# Patient Record
Sex: Female | Born: 2000 | Race: White | Hispanic: No | Marital: Single | Smoking: Never smoker
Health system: Southern US, Community
[De-identification: ages and names within clinical notes are randomized; demographics above are authoritative.]

## PROBLEM LIST (undated history)

## (undated) DIAGNOSIS — Z87442 Personal history of urinary calculi: Secondary | ICD-10-CM

## (undated) DIAGNOSIS — D72821 Monocytosis (symptomatic): Secondary | ICD-10-CM

## (undated) DIAGNOSIS — K4090 Unilateral inguinal hernia, without obstruction or gangrene, not specified as recurrent: Secondary | ICD-10-CM

## (undated) HISTORY — DX: Monocytosis (symptomatic): D72.821

---
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
## 2018-06-24 ENCOUNTER — Telehealth: Payer: Self-pay | Admitting: Urology

## 2018-06-24 DIAGNOSIS — N132 Hydronephrosis with renal and ureteral calculous obstruction: Secondary | ICD-10-CM | POA: Diagnosis not present

## 2018-06-24 DIAGNOSIS — R112 Nausea with vomiting, unspecified: Secondary | ICD-10-CM | POA: Diagnosis not present

## 2018-06-24 DIAGNOSIS — R109 Unspecified abdominal pain: Secondary | ICD-10-CM

## 2018-06-24 DIAGNOSIS — R300 Dysuria: Secondary | ICD-10-CM | POA: Diagnosis not present

## 2018-06-24 DIAGNOSIS — N200 Calculus of kidney: Secondary | ICD-10-CM | POA: Diagnosis not present

## 2018-06-24 DIAGNOSIS — R319 Hematuria, unspecified: Secondary | ICD-10-CM | POA: Diagnosis not present

## 2018-06-24 DIAGNOSIS — R630 Anorexia: Secondary | ICD-10-CM | POA: Diagnosis not present

## 2018-06-24 NOTE — Telephone Encounter (Signed)
Patient called over the weekend, seeking to establish care at [facility].  She was seen in the emergency room at [facility] with acute onset left flank pain found to have mild left-sided hydronephrosis.  She is also noted to have a right lower pole stone measuring 0.8 cm.  They will to get her pain under control and she was discharged home.  She was given a urinary strainer.  Plan to go ahead and order a KUB to establish the size and location of the stone (unless she passes the stone in the interim) and see her early this week.  Vanna Scotland, MD

## 2018-06-25 ENCOUNTER — Ambulatory Visit
Admission: RE | Admit: 2018-06-25 | Discharge: 2018-06-25 | Disposition: A | Discharge status: Home / Self Care | Payer: 59 | Source: Ambulatory Visit | Attending: Urology | Admitting: Urology

## 2018-06-25 DIAGNOSIS — R109 Unspecified abdominal pain: Secondary | ICD-10-CM | POA: Diagnosis not present

## 2018-06-26 ENCOUNTER — Other Ambulatory Visit: Payer: Self-pay | Admitting: Radiology

## 2018-06-26 DIAGNOSIS — N201 Calculus of ureter: Secondary | ICD-10-CM

## 2018-06-27 ENCOUNTER — Other Ambulatory Visit: Payer: Self-pay | Admitting: Radiology

## 2018-06-27 DIAGNOSIS — N201 Calculus of ureter: Secondary | ICD-10-CM

## 2018-06-27 MED ORDER — CIPROFLOXACIN HCL 500 MG PO TABS
500.0000 mg | ORAL_TABLET | ORAL | Status: AC
  Administered 2018-06-28: 500 mg via ORAL

## 2018-06-28 ENCOUNTER — Ambulatory Visit (INDEPENDENT_AMBULATORY_CARE_PROVIDER_SITE_OTHER): Payer: 59 | Admitting: Urology

## 2018-06-28 ENCOUNTER — Ambulatory Visit
Admission: RE | Admit: 2018-06-28 | Discharge: 2018-06-28 | Disposition: A | Discharge status: Home / Self Care | Payer: 59 | Source: Ambulatory Visit | Attending: Urology | Admitting: Urology

## 2018-06-28 ENCOUNTER — Other Ambulatory Visit: Payer: Self-pay

## 2018-06-28 ENCOUNTER — Encounter: Payer: Self-pay | Admitting: Urology

## 2018-06-28 ENCOUNTER — Encounter
Admission: RE | Disposition: A | Discharge status: Home / Self Care | Payer: Self-pay | Source: Ambulatory Visit | Attending: Urology

## 2018-06-28 ENCOUNTER — Encounter: Payer: Self-pay | Admitting: *Deleted

## 2018-06-28 VITALS — BP 125/87 | HR 75 | Ht 65.0 in | Wt 149.0 lb

## 2018-06-28 DIAGNOSIS — N132 Hydronephrosis with renal and ureteral calculous obstruction: Secondary | ICD-10-CM | POA: Diagnosis not present

## 2018-06-28 DIAGNOSIS — N200 Calculus of kidney: Secondary | ICD-10-CM | POA: Diagnosis not present

## 2018-06-28 DIAGNOSIS — N201 Calculus of ureter: Secondary | ICD-10-CM

## 2018-06-28 DIAGNOSIS — R109 Unspecified abdominal pain: Secondary | ICD-10-CM | POA: Diagnosis not present

## 2018-06-28 HISTORY — DX: Unilateral inguinal hernia, without obstruction or gangrene, not specified as recurrent: K40.90

## 2018-06-28 HISTORY — PX: EXTRACORPOREAL SHOCK WAVE LITHOTRIPSY: SHX1557

## 2018-06-28 HISTORY — DX: Personal history of urinary calculi: Z87.442

## 2018-06-28 LAB — MICROSCOPIC EXAMINATION: Bacteria, UA: NONE SEEN

## 2018-06-28 LAB — URINALYSIS, COMPLETE
Bilirubin, UA: NEGATIVE
Glucose, UA: NEGATIVE
Ketones, UA: NEGATIVE
Nitrite, UA: NEGATIVE
PH UA: 7.5 (ref 5.0–7.5)
Protein, UA: NEGATIVE
SPEC GRAV UA: 1.02 (ref 1.005–1.030)
Urobilinogen, Ur: 0.2 mg/dL (ref 0.2–1.0)

## 2018-06-28 LAB — POCT PREGNANCY, URINE: Preg Test, Ur: NEGATIVE

## 2018-06-28 SURGERY — LITHOTRIPSY, ESWL
Anesthesia: Moderate Sedation | Laterality: Left

## 2018-06-28 MED ORDER — TAMSULOSIN HCL 0.4 MG PO CAPS: 0.4000 mg | ORAL_CAPSULE | Freq: Every day | ORAL | 0 refills | Status: DC

## 2018-06-28 MED ORDER — ONDANSETRON HCL 4 MG/2ML IJ SOLN
4.0000 mg | Freq: Once | INTRAMUSCULAR | Status: AC | PRN
  Administered 2018-06-28: 4 mg via INTRAVENOUS

## 2018-06-28 MED ORDER — DIPHENHYDRAMINE HCL 25 MG PO CAPS
25.0000 mg | ORAL_CAPSULE | ORAL | Status: AC
  Administered 2018-06-28: 25 mg via ORAL

## 2018-06-28 MED ORDER — DIAZEPAM 5 MG PO TABS
10.0000 mg | ORAL_TABLET | ORAL | Status: AC
  Administered 2018-06-28: 10 mg via ORAL

## 2018-06-28 MED ORDER — DIAZEPAM 5 MG PO TABS
ORAL_TABLET | ORAL | Status: AC
  Filled 2018-06-28: qty 2

## 2018-06-28 MED ORDER — DOCUSATE SODIUM 100 MG PO CAPS: 100.0000 mg | ORAL_CAPSULE | Freq: Two times a day (BID) | ORAL | 0 refills | Status: DC

## 2018-06-28 MED ORDER — HYDROCODONE-ACETAMINOPHEN 5-325 MG PO TABS: 1.0000 | ORAL_TABLET | Freq: Four times a day (QID) | ORAL | 0 refills | Status: DC | PRN

## 2018-06-28 MED ORDER — ONDANSETRON 4 MG PO TBDP
ORAL_TABLET | ORAL | Status: AC
  Filled 2018-06-28: qty 1

## 2018-06-28 MED ORDER — ONDANSETRON HCL 4 MG/2ML IJ SOLN
INTRAMUSCULAR | Status: AC
  Administered 2018-06-28: 4 mg via INTRAVENOUS
  Filled 2018-06-28: qty 2

## 2018-06-28 MED ORDER — CIPROFLOXACIN HCL 500 MG PO TABS
ORAL_TABLET | ORAL | Status: AC
  Filled 2018-06-28: qty 1

## 2018-06-28 MED ORDER — SODIUM CHLORIDE 0.9 % IV SOLN
INTRAVENOUS | Status: DC
  Administered 2018-06-28: 10:00:00 via INTRAVENOUS

## 2018-06-28 MED ORDER — DIPHENHYDRAMINE HCL 25 MG PO CAPS
ORAL_CAPSULE | ORAL | Status: AC
  Filled 2018-06-28: qty 1

## 2018-06-28 NOTE — Interval H&P Note (Signed)
History and Physical Interval Note:  06/28/2018 7:54 PM  Jennifer Andersen  has presented today for surgery, with the diagnosis of Kidney stone  The various methods of treatment have been discussed with the patient and family. After consideration of risks, benefits and other options for treatment, the patient has consented to  Procedure(s): EXTRACORPOREAL SHOCK WAVE LITHOTRIPSY (ESWL) (Left) as a surgical intervention .  The patient's history has been reviewed, patient examined, no change in status, stable for surgery.  I have reviewed the patient's chart and labs.  Questions were answered to the patient's satisfaction.     Vanna Scotland

## 2018-06-28 NOTE — Discharge Instructions (Signed)
AMBULATORY SURGERY  DISCHARGE INSTRUCTIONS   1) The drugs that you were given will stay in your system until tomorrow so for the next 24 hours you should not:  A) Drive an automobile B) Make any legal decisions C) Drink any alcoholic beverage   2) You may resume regular meals tomorrow.  Today it is better to start with liquids and gradually work up to solid foods.  You may eat anything you prefer, but it is better to start with liquids, then soup and crackers, and gradually work up to solid foods.   3) Please notify your doctor immediately if you have any unusual bleeding, trouble breathing, redness and pain at the surgery site, drainage, fever, or pain not relieved by medication.    4) Additional Instructions:   Please contact your physician with any problems or Same Day Surgery at [phone], Monday through Friday 6 am to 4 pm, or [place] [facility] [place] Main number at [phone].See [facility] discharge instructions in chart.  

## 2018-06-28 NOTE — H&P (View-Only) (Signed)
06/28/2018 8:54 AM   Jennifer Andersen 2001-07-30 [id number]  Referring provider: Edwyna Perfect, MD [phone] [address] [place], [place] [address]  Chief Complaint  Patient presents with  . Nephrolithiasis    New Patient    HPI: 17 year old female who presents today for evaluation/treatment for left distal ureteral stone.Marland Kitchen  She was seen in the emergency severe left flank pain with associated nausea and vomiting.  She had a renal ultrasound performed at that time which left hydronephrosis, bilateral extrarenal pelvices, nonobstructing stone.  She had a KUB which demonstrated a left distal stone.  KUB repeated today shows no interval progression.  She does not have any overt pain at this point time she does feel some soreness in her left lower quadrant.  No further nausea or vomiting.  No fevers or chills.  No dysuria or gross hematuria.  She denies previous stone episodes.  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    PMH: History reviewed. No pertinent past medical history.  Surgical History: Past Surgical History:  Procedure Laterality Date  . HERNIA REPAIR      Home Medications:  Allergies as of 06/28/2018   No Known Allergies     Medication List    as of 06/28/2018  8:54 AM   You have not been prescribed any medications.     Allergies: No Known Allergies  Family History: Family History  Problem Relation Age of Onset  . Asthma Sister     Social History:  reports that she has never smoked. She has never used smokeless tobacco. She reports that she does not drink alcohol or use drugs.  ROS: UROLOGY Frequent Urination?: No Hard to postpone urination?: No Burning/pain with urination?: No Get up at night to urinate?: No Leakage of urine?: No Urine stream starts and stops?: No Trouble starting stream?: No Do you have to strain to urinate?: No Blood in urine?: No Urinary tract infection?: No Sexually transmitted disease?: No Injury to kidneys or bladder?:  No Painful intercourse?: No Weak stream?: No Currently pregnant?: No Vaginal bleeding?: No Last menstrual period?: n  Gastrointestinal Nausea?: No Vomiting?: No Indigestion/heartburn?: No Diarrhea?: No Constipation?: No  Constitutional Fever: No Night sweats?: No Weight loss?: No Fatigue?: No  Skin Skin rash/lesions?: No Itching?: No  Eyes Blurred vision?: No Double vision?: No  Ears/Nose/Throat Sore throat?: No Sinus problems?: No  Hematologic/Lymphatic Swollen glands?: No Easy bruising?: No  Cardiovascular Leg swelling?: No Chest pain?: No  Respiratory Cough?: Yes Shortness of breath?: No  Endocrine Excessive thirst?: No  Musculoskeletal Joint pain?: No  Neurological Headaches?: No Dizziness?: No  Psychologic Depression?: No Anxiety?: No  Physical Exam: BP 125/87   Pulse 75   Ht 5' 5" (1.651 m)   Wt 149 lb (67.6 kg)   LMP 06/08/2018   BMI 24.79 kg/m   Constitutional:  Alert and oriented, No acute distress. HEENT: [place] AT, moist mucus membranes.  Trachea midline, no masses. Cardiovascular: No clubbing, cyanosis, or edema. Respiratory: Normal respiratory effort, no increased work of breathing. GI: Abdomen is soft, nontender, nondistended, no abdominal masses GU: No CVA tenderness Neurologic: Grossly intact, no focal deficits, moving all 4 extremities. Psychiatric: Normal mood and affect.  Laboratory Data:  Urinalysis UA reviewed today, no red blood cells or white blood cells.  No concern for UTI.  Pertinent Imaging: Results for orders placed during the hospital encounter of 06/28/18  Abdomen 1 view (KUB)   Narrative CLINICAL DATA:  Acute left flank pain.  EXAM:  ABDOMEN - 1 VIEW  COMPARISON:  Radiograph of June 25, 2018.  FINDINGS: The bowel gas pattern is normal. Stable calcification seen in left pelvis which may represent either phlebolith or distal left ureteral calculus.  IMPRESSION: Stable left pelvic calcification  is noted which may represent either phlebolith or distal left ureteral calculus.   Electronically Signed   By: Marijo Conception, M.D.   On: 06/28/2018 08:43    KUB personally reviewed today.  Assessment & Plan:    1. Left ureteral stone Given her history of left flank pain, left hydronephrosis with a calcific lesion in the left pelvis, clinically this represents a 4 mm obstructing left ureteral calculus which is failed to pass spontaneously.  We discussed various treatment options including ESWL vs. ureteroscopy, laser lithotripsy, and stent vs. MET. We discussed the risks and benefits of both including bleeding, infection, damage to surrounding structures, efficacy with need for possible further intervention, and need for temporary ureteral stent.  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  Stone diet recommendations were discussed today.  - Urinalysis, Complete  2. Renal calculus, right Nonobstructing stone per ultrasound, difficult to visualize on KUB today but it does appear that she may have a possibly 5 mm left lower pole stone on previous KUB.  Recommend observation at this time for this stone.   Hollice Espy, MD  [facility] [address] [place], [place] [address] [phone]

## 2018-06-28 NOTE — Progress Notes (Signed)
 06/28/2018 8:54 AM   Jennifer Andersen 03/23/2001 [id number]  Referring provider: Mitra, Kunal, MD [address] [place], [place] [address]  Chief Complaint  Patient presents with  . Nephrolithiasis    New Patient    HPI: 17-year-old female who presents today for evaluation/treatment for left distal ureteral stone..  She was seen in the emergency severe left flank pain with associated nausea and vomiting.  She had a renal ultrasound performed at that time which left hydronephrosis, bilateral extrarenal pelvices, nonobstructing stone.  She had a KUB which demonstrated a left distal stone.  KUB repeated today shows no interval progression.  She does not have any overt pain at this point time she does feel some soreness in her left lower quadrant.  No further nausea or vomiting.  No fevers or chills.  No dysuria or gross hematuria.  She denies previous stone episodes.  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    PMH: History reviewed. No pertinent past medical history.  Surgical History: Past Surgical History:  Procedure Laterality Date  . HERNIA REPAIR      Home Medications:  Allergies as of 06/28/2018   No Known Allergies     Medication List    as of 06/28/2018  8:54 AM   You have not been prescribed any medications.     Allergies: No Known Allergies  Family History: Family History  Problem Relation Age of Onset  . Asthma Sister     Social History:  reports that she has never smoked. She has never used smokeless tobacco. She reports that she does not drink alcohol or use drugs.  ROS: UROLOGY Frequent Urination?: No Hard to postpone urination?: No Burning/pain with urination?: No Get up at night to urinate?: No Leakage of urine?: No Urine stream starts and stops?: No Trouble starting stream?: No Do you have to strain to urinate?: No Blood in urine?: No Urinary tract infection?: No Sexually transmitted disease?: No Injury to kidneys or bladder?:  No Painful intercourse?: No Weak stream?: No Currently pregnant?: No Vaginal bleeding?: No Last menstrual period?: n  Gastrointestinal Nausea?: No Vomiting?: No Indigestion/heartburn?: No Diarrhea?: No Constipation?: No  Constitutional Fever: No Night sweats?: No Weight loss?: No Fatigue?: No  Skin Skin rash/lesions?: No Itching?: No  Eyes Blurred vision?: No Double vision?: No  Ears/Nose/Throat Sore throat?: No Sinus problems?: No  Hematologic/Lymphatic Swollen glands?: No Easy bruising?: No  Cardiovascular Leg swelling?: No Chest pain?: No  Respiratory Cough?: Yes Shortness of breath?: No  Endocrine Excessive thirst?: No  Musculoskeletal Joint pain?: No  Neurological Headaches?: No Dizziness?: No  Psychologic Depression?: No Anxiety?: No  Physical Exam: BP 125/87   Pulse 75   Ht 5' 5" (1.651 m)   Wt 149 lb (67.6 kg)   LMP 06/08/2018   BMI 24.79 kg/m   Constitutional:  Alert and oriented, No acute distress. HEENT: [place] AT, moist mucus membranes.  Trachea midline, no masses. Cardiovascular: No clubbing, cyanosis, or edema. Respiratory: Normal respiratory effort, no increased work of breathing. GI: Abdomen is soft, nontender, nondistended, no abdominal masses GU: No CVA tenderness Neurologic: Grossly intact, no focal deficits, moving all 4 extremities. Psychiatric: Normal mood and affect.  Laboratory Data:  Urinalysis UA reviewed today, no red blood cells or white blood cells.  No concern for UTI.  Pertinent Imaging: Results for orders placed during the hospital encounter of 06/28/18  Abdomen 1 view (KUB)   Narrative CLINICAL DATA:  Acute left flank pain.  EXAM:   ABDOMEN - 1 VIEW  COMPARISON:  Radiograph of June 25, 2018.  FINDINGS: The bowel gas pattern is normal. Stable calcification seen in left pelvis which may represent either phlebolith or distal left ureteral calculus.  IMPRESSION: Stable left pelvic calcification  is noted which may represent either phlebolith or distal left ureteral calculus.   Electronically Signed   By: James  Green Jr, M.D.   On: 06/28/2018 08:43    KUB personally reviewed today.  Assessment & Plan:    1. Left ureteral stone Given her history of left flank pain, left hydronephrosis with a calcific lesion in the left pelvis, clinically this represents a 4 mm obstructing left ureteral calculus which is failed to pass spontaneously.  We discussed various treatment options including ESWL vs. ureteroscopy, laser lithotripsy, and stent vs. MET. We discussed the risks and benefits of both including bleeding, infection, damage to surrounding structures, efficacy with need for possible further intervention, and need for temporary ureteral stent.  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  Stone diet recommendations were discussed today.  - Urinalysis, Complete  2. Renal calculus, right Nonobstructing stone per ultrasound, difficult to visualize on KUB today but it does appear that she may have a possibly 5 mm left lower pole stone on previous KUB.  Recommend observation at this time for this stone.   Ashley Brandon, MD  [facility] [address], [address] , [place] [address] [phone]  

## 2018-06-28 NOTE — OR Nursing (Signed)
Discharge instructions discussed with pt and mother. Both voice understanding. 

## 2018-06-29 ENCOUNTER — Encounter: Payer: Self-pay | Admitting: Urology

## 2018-07-02 ENCOUNTER — Encounter: Payer: Self-pay | Admitting: Urology

## 2018-07-02 DIAGNOSIS — N200 Calculus of kidney: Secondary | ICD-10-CM | POA: Diagnosis not present

## 2018-07-09 ENCOUNTER — Other Ambulatory Visit: Payer: Self-pay | Admitting: Urology

## 2018-07-09 DIAGNOSIS — Z23 Encounter for immunization: Secondary | ICD-10-CM | POA: Diagnosis not present

## 2018-07-12 ENCOUNTER — Ambulatory Visit
Admission: RE | Admit: 2018-07-12 | Discharge: 2018-07-12 | Disposition: A | Discharge status: Home / Self Care | Payer: 59 | Source: Ambulatory Visit | Attending: Urology | Admitting: Urology

## 2018-07-12 DIAGNOSIS — N201 Calculus of ureter: Secondary | ICD-10-CM | POA: Diagnosis not present

## 2018-07-12 DIAGNOSIS — N200 Calculus of kidney: Secondary | ICD-10-CM | POA: Diagnosis not present

## 2018-07-13 ENCOUNTER — Telehealth: Payer: Self-pay | Admitting: Family Medicine

## 2018-07-13 NOTE — Telephone Encounter (Signed)
Patient's mother notified.

## 2018-07-13 NOTE — Telephone Encounter (Signed)
-----   Message from Vanna Scotland, MD sent at 07/13/2018 11:47 AM EDT ----- Please let Jennifer Andersen know her stone is GONE!!!  Vanna Scotland, MD

## 2018-09-05 DIAGNOSIS — Z0000 Encounter for general adult medical examination without abnormal findings: Secondary | ICD-10-CM | POA: Diagnosis not present

## 2018-09-05 DIAGNOSIS — Z713 Dietary counseling and surveillance: Secondary | ICD-10-CM | POA: Diagnosis not present

## 2018-09-05 DIAGNOSIS — Z68.41 Body mass index (BMI) pediatric, 5th percentile to less than 85th percentile for age: Secondary | ICD-10-CM | POA: Diagnosis not present

## 2018-11-24 IMAGING — CR DG ABDOMEN 1V
1 series · 1 of 1 positions shown · non-contrast
Comparison: 06/28/2018

CLINICAL DATA: Kidney stone

EXAM:
ABDOMEN - 1 VIEW

[dg abd 1 view]
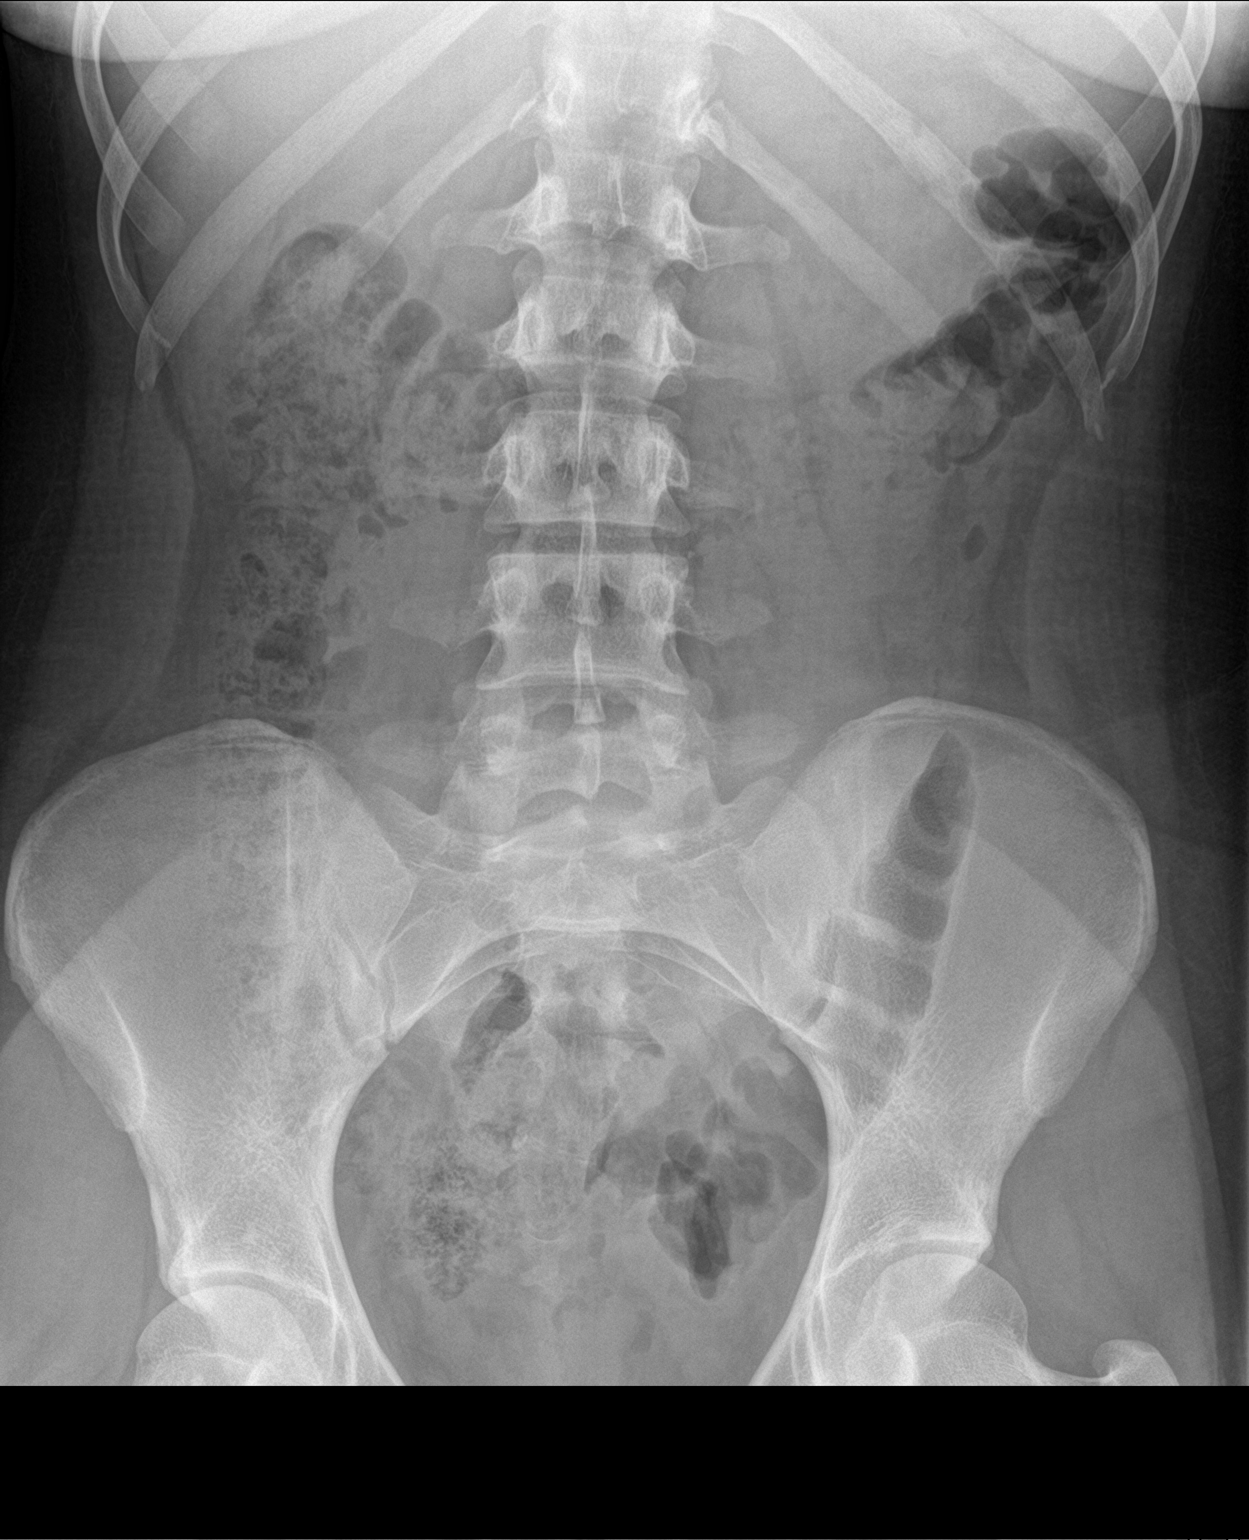

[1 of 1 positions shown; findings below may reference images not displayed]

FINDINGS: Nonobstructed gas pattern with moderate stool in the colon. No
radiopaque calculi are seen. Left pelvic calcification is not
visible today.
IMPRESSION: Previously noted left pelvic calcification is not clearly identified
on today's study. Nonobstructed gas pattern

## 2019-03-07 DIAGNOSIS — B078 Other viral warts: Secondary | ICD-10-CM | POA: Diagnosis not present

## 2019-03-23 DIAGNOSIS — Z20828 Contact with and (suspected) exposure to other viral communicable diseases: Secondary | ICD-10-CM | POA: Diagnosis not present

## 2019-03-23 DIAGNOSIS — U071 COVID-19: Secondary | ICD-10-CM | POA: Diagnosis not present

## 2019-07-18 DIAGNOSIS — B078 Other viral warts: Secondary | ICD-10-CM | POA: Diagnosis not present

## 2019-10-01 DIAGNOSIS — B079 Viral wart, unspecified: Secondary | ICD-10-CM | POA: Diagnosis not present

## 2019-10-01 DIAGNOSIS — Z713 Dietary counseling and surveillance: Secondary | ICD-10-CM | POA: Diagnosis not present

## 2019-10-01 DIAGNOSIS — Z0000 Encounter for general adult medical examination without abnormal findings: Secondary | ICD-10-CM | POA: Diagnosis not present

## 2019-10-01 DIAGNOSIS — Z20828 Contact with and (suspected) exposure to other viral communicable diseases: Secondary | ICD-10-CM | POA: Diagnosis not present

## 2019-10-01 DIAGNOSIS — Z6826 Body mass index (BMI) 26.0-26.9, adult: Secondary | ICD-10-CM | POA: Diagnosis not present

## 2019-12-30 DIAGNOSIS — B2790 Infectious mononucleosis, unspecified without complication: Secondary | ICD-10-CM | POA: Diagnosis not present

## 2019-12-30 DIAGNOSIS — Z20822 Contact with and (suspected) exposure to covid-19: Secondary | ICD-10-CM | POA: Diagnosis not present

## 2019-12-30 DIAGNOSIS — J029 Acute pharyngitis, unspecified: Secondary | ICD-10-CM | POA: Diagnosis not present

## 2019-12-30 DIAGNOSIS — R509 Fever, unspecified: Secondary | ICD-10-CM | POA: Diagnosis not present

## 2020-01-02 ENCOUNTER — Other Ambulatory Visit: Payer: Self-pay

## 2020-01-02 ENCOUNTER — Ambulatory Visit
Admission: EM | Admit: 2020-01-02 | Discharge: 2020-01-02 | Disposition: A | Discharge status: Home / Self Care | Payer: 59

## 2020-01-03 DIAGNOSIS — R591 Generalized enlarged lymph nodes: Secondary | ICD-10-CM | POA: Diagnosis not present

## 2020-01-03 DIAGNOSIS — R1319 Other dysphagia: Secondary | ICD-10-CM | POA: Diagnosis not present

## 2020-01-03 DIAGNOSIS — Z20822 Contact with and (suspected) exposure to covid-19: Secondary | ICD-10-CM | POA: Diagnosis not present

## 2020-01-03 DIAGNOSIS — B2799 Infectious mononucleosis, unspecified with other complication: Secondary | ICD-10-CM | POA: Diagnosis not present

## 2020-01-03 DIAGNOSIS — Z09 Encounter for follow-up examination after completed treatment for conditions other than malignant neoplasm: Secondary | ICD-10-CM | POA: Diagnosis not present

## 2020-01-03 DIAGNOSIS — M791 Myalgia, unspecified site: Secondary | ICD-10-CM | POA: Diagnosis not present

## 2020-01-03 DIAGNOSIS — J029 Acute pharyngitis, unspecified: Secondary | ICD-10-CM | POA: Diagnosis not present

## 2020-01-03 DIAGNOSIS — E860 Dehydration: Secondary | ICD-10-CM | POA: Diagnosis not present

## 2020-01-04 DIAGNOSIS — R599 Enlarged lymph nodes, unspecified: Secondary | ICD-10-CM | POA: Diagnosis not present

## 2020-01-04 DIAGNOSIS — M542 Cervicalgia: Secondary | ICD-10-CM | POA: Diagnosis not present

## 2020-01-04 DIAGNOSIS — R609 Edema, unspecified: Secondary | ICD-10-CM | POA: Diagnosis not present

## 2020-01-04 DIAGNOSIS — R590 Localized enlarged lymph nodes: Secondary | ICD-10-CM | POA: Diagnosis not present

## 2020-01-04 DIAGNOSIS — Z8619 Personal history of other infectious and parasitic diseases: Secondary | ICD-10-CM | POA: Diagnosis not present

## 2020-01-04 DIAGNOSIS — J36 Peritonsillar abscess: Secondary | ICD-10-CM | POA: Diagnosis not present

## 2020-01-04 DIAGNOSIS — E041 Nontoxic single thyroid nodule: Secondary | ICD-10-CM | POA: Diagnosis not present

## 2020-03-25 DIAGNOSIS — Z111 Encounter for screening for respiratory tuberculosis: Secondary | ICD-10-CM | POA: Diagnosis not present

## 2020-03-25 DIAGNOSIS — Z0184 Encounter for antibody response examination: Secondary | ICD-10-CM | POA: Diagnosis not present

## 2020-06-05 DIAGNOSIS — Z113 Encounter for screening for infections with a predominantly sexual mode of transmission: Secondary | ICD-10-CM | POA: Diagnosis not present

## 2020-07-07 ENCOUNTER — Encounter: Payer: Self-pay | Admitting: Obstetrics and Gynecology

## 2020-07-07 ENCOUNTER — Other Ambulatory Visit: Payer: Self-pay

## 2020-07-07 ENCOUNTER — Ambulatory Visit: Payer: 59 | Admitting: Obstetrics and Gynecology

## 2020-07-07 VITALS — BP 124/79 | HR 82 | Ht 65.0 in | Wt 147.7 lb

## 2020-07-07 DIAGNOSIS — Z3009 Encounter for other general counseling and advice on contraception: Secondary | ICD-10-CM | POA: Diagnosis not present

## 2020-07-07 DIAGNOSIS — Z30430 Encounter for insertion of intrauterine contraceptive device: Secondary | ICD-10-CM | POA: Diagnosis not present

## 2020-07-07 LAB — POCT URINE PREGNANCY: Preg Test, Ur: NEGATIVE

## 2020-07-07 NOTE — Addendum Note (Signed)
Addended by: Silvano Bilis on: 07/07/2020 03:38 PM   Modules accepted: Orders

## 2020-07-07 NOTE — Progress Notes (Signed)
Pt present today for birth control consult. Pt stated that she would like to speak to the provider about birth control options. UPT completed today results negative.

## 2020-07-07 NOTE — Progress Notes (Signed)
GYNECOLOGY CLINIC PROGRESS NOTE Subjective:    Jennifer Andersen is a 19 y.o. G0P0000 female who presents for contraception counseling. The patient has no complaints today. She is considering [place] IUD. Has never used birth control before, and has questions regarding the different methods. She is accompanied by her mother today. The patient is sexually active. Pertinent past medical history: none.  Menstrual History: OB History    Gravida  0   Para  0   Term  0   Preterm  0   AB  0   Living  0     SAB  0   TAB  0   Ectopic  0   Multiple  0   Live Births  0            Patient's last menstrual period was 07/03/2020. Period Duration (Days): 4-7 Period Pattern: Regular Menstrual Flow: Moderate Menstrual Control: Tampon Menstrual Control Change Freq (Hours): 8 Dysmenorrhea: (!) Moderate Dysmenorrhea Symptoms: Cramping     Review of Systems Pertinent items noted in HPI and remainder of comprehensive ROS otherwise negative.   Objective:    BP 124/79   Pulse 82   Ht 5\' 5"  (1.651 m)   Wt 147 lb 11.2 oz (67 kg)   LMP 07/03/2020   BMI 24.58 kg/m  General appearance: alert and no distress Abdomen: soft, non-tender; bowel sounds normal; no masses,  no organomegaly Pelvic: external genitalia normal, rectovaginal septum normal.  Vagina without discharge.  Cervix normal appearing, no lesions and no motion tenderness.  Uterus mobile, nontender, normal shape and size.  Adnexae non-palpable, nontender bilaterally.  Extremities: extremities normal, atraumatic, no cyanosis or edema Skin: Skin color, texture, turgor normal. No rashes or lesions Neurologic: Grossly normal   Assessment:    19 y.o. female desiring contraception.   Plan:   - Reviewed all forms of birth control options available including abstinence; fertility period awareness methods; over the counter/barrier methods; hormonal contraceptive medication including pill, patch, ring,  injection,contraceptive implant; hormonal and nonhormonal IUDs; permanent sterilization options were not discussed. Risks and benefits reviewed.  Questions were answered.  After discussion, patient has decided on Skyla IUD.  Insertion performed today (see below for insertion procedure note).  - Follow up in 4 weeks for IUD string check.      GYNECOLOGY OFFICE PROCEDURE NOTE  Jennifer Andersen is a 19 y.o. G0P0000 here for [facility] IUD insertion. No GYN concerns.  No prior pap history.   IUD Insertion Procedure Note Patient identified, informed consent performed, consent signed.   Discussed risks of irregular bleeding, cramping, infection, malpositioning or misplacement of the IUD outside the uterus which may require further procedure such as laparoscopy. Also discussed >99% contraception efficacy, increased risk of ectopic pregnancy with failure of method.   Emphasized that this did not protect against STIs, condoms recommended during all sexual encounters. Time out was performed.  Urine pregnancy test negative.  Speculum placed in the vagina.  Cervix visualized.  Cleaned with Betadine x 2.  Grasped anteriorly with a single tooth tenaculum.  Uterus sounded to 8 cm.  Skyla IUD placed per manufacturer's recommendations.  Strings trimmed to 3 cm. Tenaculum was removed, good hemostasis noted.  Patient tolerated procedure well.   Patient was given post-procedure instructions.  She was advised to have backup contraception for one week.  Patient was also asked to check IUD strings periodically and follow up in 4 weeks for IUD check.    Exp: 09/2021 Lot: 10/2021

## 2020-07-07 NOTE — Patient Instructions (Signed)
Intrauterine Device Insertion, Care After  This sheet gives you information about how to care for yourself after your procedure. Your health care provider may also give you more specific instructions. If you have problems or questions, contact your health care provider. What can I expect after the procedure? After the procedure, it is common to have:  Cramps and pain in the abdomen.  Light bleeding (spotting) or heavier bleeding that is like your menstrual period. This may last for up to a few days.  Lower back pain.  Dizziness.  Headaches.  Nausea. Follow these instructions at home:  Before resuming sexual activity, check to make sure that you can feel the IUD string(s). You should be able to feel the end of the string(s) below the opening of your cervix. If your IUD string is in place, you may resume sexual activity. ? If you had a hormonal IUD inserted more than 7 days after your most recent period started, you will need to use a backup method of birth control for 7 days after IUD insertion. Ask your health care provider whether this applies to you.  Continue to check that the IUD is still in place by feeling for the string(s) after every menstrual period, or once a month.  Take over-the-counter and prescription medicines only as told by your health care provider.  Do not drive or use heavy machinery while taking prescription pain medicine.  Keep all follow-up visits as told by your health care provider. This is important. Contact a health care provider if:  You have bleeding that is heavier or lasts longer than a normal menstrual cycle.  You have a fever.  You have cramps or abdominal pain that get worse or do not get better with medicine.  You develop abdominal pain that is new or is not in the same area of earlier cramping and pain.  You feel lightheaded or weak.  You have abnormal or bad-smelling discharge from your vagina.  You have pain during sexual  activity.  You have any of the following problems with your IUD string(s): ? The string bothers or hurts you or your sexual partner. ? You cannot feel the string. ? The string has gotten longer.  You can feel the IUD in your vagina.  You think you may be pregnant, or you miss your menstrual period.  You think you may have an STI (sexually transmitted infection). Get help right away if:  You have flu-like symptoms.  You have a fever and chills.  You can feel that your IUD has slipped out of place. Summary  After the procedure, it is common to have cramps and pain in the abdomen. It is also common to have light bleeding (spotting) or heavier bleeding that is like your menstrual period.  Continue to check that the IUD is still in place by feeling for the string(s) after every menstrual period, or once a month.  Keep all follow-up visits as told by your health care provider. This is important.  Contact your health care provider if you have problems with your IUD string(s), such as the string getting longer or bothering you or your sexual partner. This information is not intended to replace advice given to you by your health care provider. Make sure you discuss any questions you have with your health care provider. Document Revised: 08/25/2017 Document Reviewed: 08/03/2016 Elsevier Patient Education  2020 Elsevier Inc.  

## 2020-07-17 ENCOUNTER — Encounter: Payer: 59 | Admitting: Certified Nurse Midwife

## 2020-08-11 NOTE — Progress Notes (Signed)
Pt present for IUD string check. Pt stated that she was doing well no problems.  

## 2020-08-12 ENCOUNTER — Other Ambulatory Visit (HOSPITAL_COMMUNITY)
Admission: RE | Admit: 2020-08-12 | Discharge: 2020-08-12 | Disposition: A | Discharge status: Home / Self Care | Payer: 59 | Source: Ambulatory Visit | Attending: Obstetrics and Gynecology | Admitting: Obstetrics and Gynecology

## 2020-08-12 ENCOUNTER — Other Ambulatory Visit: Payer: Self-pay

## 2020-08-12 ENCOUNTER — Encounter: Payer: Self-pay | Admitting: Obstetrics and Gynecology

## 2020-08-12 ENCOUNTER — Ambulatory Visit: Payer: 59 | Admitting: Obstetrics and Gynecology

## 2020-08-12 VITALS — BP 117/68 | HR 82 | Ht 65.0 in | Wt 147.0 lb

## 2020-08-12 DIAGNOSIS — Z124 Encounter for screening for malignant neoplasm of cervix: Secondary | ICD-10-CM

## 2020-08-12 DIAGNOSIS — Z30431 Encounter for routine checking of intrauterine contraceptive device: Secondary | ICD-10-CM

## 2020-08-12 NOTE — Patient Instructions (Signed)

## 2020-08-12 NOTE — Progress Notes (Signed)
GYNECOLOGY OFFICE ENCOUNTER NOTE  History:  19 y.o. G0P0000 here today for today for IUD string check; Skyla  IUD was placed 4 weeks ago. No complaints about the IUD, no concerning side effects.  The following portions of the patient's history were reviewed and updated as appropriate: allergies, current medications, past family history, past medical history, past social history, past surgical history and problem list.    Review of Systems:  A comprehensive review of systems was negative.   Objective:  Physical Exam Blood pressure 117/68, pulse 82, height 5\' 5"  (1.651 m), weight 147 lb (66.7 kg). CONSTITUTIONAL: Well-developed, well-nourished female in no acute distress.  ABDOMEN: Soft, no distention noted.   PELVIC: Normal appearing external genitalia; normal appearing vaginal mucosa and cervix.  IUD strings visualized, about 3 cm in length outside cervix.  EXTREMITIES: extremities normal, atraumatic, no cyanosis or edema NEUROLOGIC: Grossly normal   Assessment & Plan:  - Patient to keep IUD in place for up to three years; can come in for removal if she desires pregnancy earlier or for any concerning side effects. - Patient approaching age of cervical cancer screening.  Will be 19 in 2-3 weeks.  Offered option of completing pap smear today with IUD check or waiting until her annual with her Pediatrician (who may or may not perform one) during the summer. Patient ok to have it performed today.  Will notify of results by Mychart.    07-22[birth year]1976, MD [facility]

## 2020-08-12 NOTE — Addendum Note (Signed)
Addended by: Silvano Bilis on: 08/12/2020 09:41 AM   Modules accepted: Orders

## 2020-08-14 LAB — CYTOLOGY - PAP
Chlamydia: NEGATIVE
Comment: NEGATIVE
Comment: NEGATIVE
Comment: NORMAL
Diagnosis: NEGATIVE
Diagnosis: REACTIVE
Neisseria Gonorrhea: NEGATIVE
Trichomonas: NEGATIVE

## 2021-08-18 ENCOUNTER — Encounter: Payer: Self-pay | Admitting: Obstetrics and Gynecology

## 2022-06-01 ENCOUNTER — Ambulatory Visit: Payer: Self-pay | Admitting: Internal Medicine

## 2022-06-09 ENCOUNTER — Telehealth: Payer: Self-pay | Admitting: Internal Medicine

## 2022-06-09 NOTE — Telephone Encounter (Signed)
Pt mom called wanting to reschedule pt appointment. Pt mom stated the reason why the pt missed the appointment was because she started a new job

## 2022-06-09 NOTE — Telephone Encounter (Signed)
Pt no showed her new patient appt on 9/6/203. Mother states that she missed her appt because she started a new job. Mother is wanting to know if pt can be rescheduled.

## 2022-06-10 NOTE — Telephone Encounter (Signed)
LMTCB. Ok to schedule pt for a new patient appt.

## 2022-06-14 NOTE — Telephone Encounter (Signed)
LMTCB

## 2022-09-06 ENCOUNTER — Ambulatory Visit
Admission: EM | Admit: 2022-09-06 | Discharge: 2022-09-06 | Disposition: A | Discharge status: Home / Self Care | Payer: 59 | Attending: Internal Medicine | Admitting: Internal Medicine

## 2022-09-06 DIAGNOSIS — J069 Acute upper respiratory infection, unspecified: Secondary | ICD-10-CM | POA: Insufficient documentation

## 2022-09-06 DIAGNOSIS — Z1152 Encounter for screening for COVID-19: Secondary | ICD-10-CM | POA: Insufficient documentation

## 2022-09-06 DIAGNOSIS — Z79899 Other long term (current) drug therapy: Secondary | ICD-10-CM | POA: Insufficient documentation

## 2022-09-06 LAB — RESP PANEL BY RT-PCR (RSV, FLU A&B, COVID)  RVPGX2
Influenza A by PCR: NEGATIVE
Influenza B by PCR: NEGATIVE
Resp Syncytial Virus by PCR: NEGATIVE
SARS Coronavirus 2 by RT PCR: NEGATIVE

## 2022-09-06 LAB — GROUP A STREP BY PCR: Group A Strep by PCR: NOT DETECTED

## 2022-09-06 MED ORDER — ACETAMINOPHEN 500 MG PO TABS
500.0000 mg | ORAL_TABLET | Freq: Once | ORAL | Status: AC
  Administered 2022-09-06: 500 mg via ORAL

## 2022-09-06 MED ORDER — BENZONATATE 200 MG PO CAPS: 200.0000 mg | ORAL_CAPSULE | Freq: Three times a day (TID) | ORAL | 0 refills | Status: DC | PRN

## 2022-09-06 MED ORDER — PSEUDOEPHEDRINE HCL ER 120 MG PO TB12: 120.0000 mg | ORAL_TABLET | Freq: Two times a day (BID) | ORAL | 0 refills | Status: DC

## 2022-09-06 NOTE — ED Triage Notes (Signed)
Pt c/o body aches, sore throat, temperature 99.2 x2days  Pt asks for a covid and flu test

## 2022-09-06 NOTE — ED Provider Notes (Signed)
[facility]    CSN: [id number] Arrival date & time: 09/06/22  [id number]      History   Chief Complaint Chief Complaint  Patient presents with   Generalized Body Aches   Sore Throat    HPI Jennifer Andersen is a 21 y.o. female who presents with onset of ST, Rhinitis and body aches, temp of 99.2 since yesterday. Her ST does not feel as bad today. She is starting a mild HA right now. Has mild cough which started today.     Past Medical History:  Diagnosis Date   History of kidney stones    Inguinal hernia 2010   Monocytosis     There are no problems to display for this patient.   Past Surgical History:  Procedure Laterality Date   EXTRACORPOREAL SHOCK WAVE LITHOTRIPSY Left 06/28/2018   Procedure: EXTRACORPOREAL SHOCK WAVE LITHOTRIPSY (ESWL);  Surgeon: Hollice Espy, MD;  Location: [facility] ORS;  Service: Urology;  Laterality: Left;   HERNIA REPAIR  2010   right inguinal    OB History     Gravida  0   Para  0   Term  0   Preterm  0   AB  0   Living  0      SAB  0   IAB  0   Ectopic  0   Multiple  0   Live Births  0            Home Medications    Prior to Admission medications   Medication Sig Start Date End Date Taking? Authorizing Provider  benzonatate (TESSALON) 200 MG capsule Take 1 capsule (200 mg total) by mouth 3 (three) times daily as needed. 09/06/22  Yes Rodriguez-Southworth, Sunday Spillers, PA-C  Levonorgestrel (SKYLA) 13.5 MG IUD by Intrauterine route. Inserted 07/07/2020   Yes [provider]  pseudoephedrine (SUDAFED 12 HOUR) 120 MG 12 hr tablet Take 1 tablet (120 mg total) by mouth 2 (two) times daily. 09/06/22  Yes Rodriguez-Southworth, Sunday Spillers, PA-C    Family History Family History  Problem Relation Age of Onset   Healthy Mother    Healthy Father    Asthma Sister     Social History Social History   Tobacco Use   Smoking status: Never   Smokeless tobacco: Never  Vaping Use   Vaping Use: Never used   Substance Use Topics   Alcohol use: Yes   Drug use: No     Allergies   Patient has no known allergies.   Review of Systems Review of Systems  Constitutional:  Positive for chills, fatigue and fever. Negative for appetite change.  HENT:  Positive for congestion, rhinorrhea and sore throat. Negative for ear discharge and ear pain.   Eyes:  Negative for discharge.  Respiratory:  Positive for cough.   Gastrointestinal:  Negative for diarrhea, nausea and vomiting.  Musculoskeletal:  Positive for myalgias.  Neurological:  Positive for headaches.     Physical Exam Triage Vital Signs ED Triage Vitals  Enc Vitals Group     BP 09/06/22 1223 133/89     Pulse Rate 09/06/22 1223 (!) 129     Resp 09/06/22 1223 18     Temp 09/06/22 1223 100.2 F (37.9 C)     Temp Source 09/06/22 1223 Oral     SpO2 09/06/22 1223 100 %     Weight 09/06/22 1222 154 lb (69.9 kg)     Height 09/06/22 1222 5\' 5"  (1.651 m)  Head Circumference --      Peak Flow --      Pain Score 09/06/22 1222 0     Pain Loc --      Pain Edu? --      Excl. in [place]? --    No data found.  Updated Vital Signs BP 133/89 (BP Location: Left Arm)   Pulse (!) 129   Temp 100.2 F (37.9 C) (Oral)   Resp 18   Ht 5\' 5"  (1.651 m)   Wt 154 lb (69.9 kg)   LMP  (LMP Unknown)   SpO2 100%   BMI 25.63 kg/m   Visual Acuity Right Eye Distance:   Left Eye Distance:   Bilateral Distance:    Right Eye Near:   Left Eye Near:    Bilateral Near:      Physical Exam Vitals signs and nursing note reviewed.  Constitutional:      General: She is not in acute distress.    Appearance: Normal appearance. She is not ill-appearing, toxic-appearing or diaphoretic.  HENT:     Head: Normocephalic.     Right Ear: Tympanic membrane, ear canal and external ear normal.     Left Ear: Tympanic membrane, ear canal and external ear normal.     Nose: Nose normal.     Mouth/Throat: with moderate erythema, no exudate    Mouth: Mucous membranes  are moist.  Eyes:     General: No scleral icterus.       Right eye: No discharge.        Left eye: No discharge.     Conjunctiva/sclera: Conjunctivae normal.  Neck:     Musculoskeletal: Neck supple. No neck rigidity.  Cardiovascular:     Rate and Rhythm: Normal rate and regular rhythm.     Heart sounds: No murmur.  Pulmonary:     Effort: Pulmonary effort is normal.     Breath sounds: Normal breath sounds.  Musculoskeletal: Normal range of motion.  Lymphadenopathy:     Cervical: has mild upper cervical lymphadenopathy but they are not tender.  Skin:    General: Skin is warm and dry.     Coloration: Skin is not jaundiced.     Findings: No rash.  Neurological:     Mental Status: She is alert and oriented to person, place, and time.     Gait: Gait normal.  Psychiatric:        Mood and Affect: Mood normal.        Behavior: Behavior normal.        Thought Content: Thought content normal.        Judgment: Judgment normal.    UC Treatments / Results  Labs (all labs ordered are listed, but only abnormal results are displayed) Labs Reviewed  RESP PANEL BY RT-PCR (RSV, FLU A&B, COVID)  [id number]  GROUP A STREP BY PCR    EKG   Radiology No results found.  Procedures Procedures (including critical care time)  Medications Ordered in UC Medications  acetaminophen (TYLENOL) tablet 500 mg (500 mg Oral Given 09/06/22 1236)    Initial Impression / Assessment and Plan / UC Course  I have reviewed the triage vital signs and the nursing notes.  Pertinent labs  results that were available during my care of the patient were reviewed by me and considered in my medical decision making (see chart for details).  URI  I placed her on Tessalon and Sudafed as noted   Final Clinical Impressions(s) / UC  Diagnoses   Final diagnoses:  Upper respiratory tract infection, unspecified type   Discharge Instructions   None    ED Prescriptions     Medication Sig Dispense Auth. Provider    pseudoephedrine (SUDAFED 12 HOUR) 120 MG 12 hr tablet Take 1 tablet (120 mg total) by mouth 2 (two) times daily. 14 tablet Rodriguez-Southworth, Feiga Nadel, PA-C   benzonatate (TESSALON) 200 MG capsule Take 1 capsule (200 mg total) by mouth 3 (three) times daily as needed. 30 capsule Rodriguez-Southworth, Sunday Spillers, PA-C      PDMP not reviewed this encounter.   Shelby Mattocks, PA-C 09/06/22 1312

## 2022-09-14 ENCOUNTER — Ambulatory Visit: Payer: Self-pay | Admitting: Internal Medicine

## 2022-09-23 ENCOUNTER — Encounter: Payer: Self-pay | Admitting: Internal Medicine

## 2022-09-23 ENCOUNTER — Ambulatory Visit: Payer: 59 | Admitting: Internal Medicine

## 2022-09-23 VITALS — BP 116/74 | HR 88 | Temp 97.6°F | Ht 65.0 in | Wt 157.2 lb

## 2022-09-23 DIAGNOSIS — R635 Abnormal weight gain: Secondary | ICD-10-CM | POA: Diagnosis not present

## 2022-09-23 DIAGNOSIS — F5102 Adjustment insomnia: Secondary | ICD-10-CM

## 2022-09-23 DIAGNOSIS — Z87442 Personal history of urinary calculi: Secondary | ICD-10-CM

## 2022-09-23 DIAGNOSIS — E663 Overweight: Secondary | ICD-10-CM | POA: Diagnosis not present

## 2022-09-23 DIAGNOSIS — Z8619 Personal history of other infectious and parasitic diseases: Secondary | ICD-10-CM | POA: Diagnosis not present

## 2022-09-23 LAB — LIPID PANEL
Cholesterol: 143 mg/dL (ref 0–200)
HDL: 47.6 mg/dL (ref >39.00)
LDL Cholesterol: 74 mg/dL (ref 0–99)
NonHDL: 95.85
Total CHOL/HDL Ratio: 3
Triglycerides: 107 mg/dL (ref 0.0–149.0)
VLDL: 21.4 mg/dL (ref 0.0–40.0)

## 2022-09-23 LAB — COMPREHENSIVE METABOLIC PANEL
ALT: 9 U/L (ref 0–35)
AST: 11 U/L (ref 0–37)
Albumin: 4.3 g/dL (ref 3.5–5.2)
Alkaline Phosphatase: 43 U/L (ref 39–117)
BUN: 11 mg/dL (ref 6–23)
CO2: 30 mEq/L (ref 19–32)
Calcium: 9.4 mg/dL (ref 8.4–10.5)
Chloride: 102 mEq/L (ref 96–112)
Creatinine, Ser: 0.9 mg/dL (ref 0.40–1.20)
GFR: 90.38 mL/min (ref >60.00)
Glucose, Bld: 100 mg/dL — ABNORMAL HIGH (ref 70–99)
Potassium: 4 mEq/L (ref 3.5–5.1)
Sodium: 139 mEq/L (ref 135–145)
Total Bilirubin: 0.7 mg/dL (ref 0.2–1.2)
Total Protein: 7 g/dL (ref 6.0–8.3)

## 2022-09-23 LAB — TSH: TSH: 1.34 u[IU]/mL (ref 0.35–5.50)

## 2022-09-23 NOTE — Patient Instructions (Addendum)
Welcome Jennifer Andersen!  It was very nice meeting you today!    For your occasional insomnia:  if melatonin alone doesn't work:   You might want to try using Relaxium for insomnia  (as seen on TV commercials) . It is available through [employer] and contains all natural supplements:  Melatonin 5 mg  Chamomile 25 mg Passionflower extract 75 mg GABA 100 mg Ashwaganda extract 125 mg Magnesium citrate, glycinate, oxide (100 mg)  L tryptophan 500 mg Valerest (proprietary  ingredient ; probably valeria root extract)

## 2022-09-23 NOTE — Progress Notes (Unsigned)
   Subjective:  Patient ID: Jennifer Andersen, female    DOB: 2001-03-29  Age: 21 y.o. MRN: [id number]  CC: There were no encounter diagnoses.  HPI Jennifer Andersen presents for establishment of care.  Referred by her mother French Ana  Recovering from viral URI in Dec 12 . Strep, Covid,  RSV negative  H/o kidney stone occurred freshman year in college,  treated with lithotripsy .  None since then.    Struggling with weight gain since working  No trouble sleeping  when on night   Diagnosed with  IM in 2021 . Severe pharyngitis, required IV fluids, steroids,  CT showed tonsillar abscess on left requiring drainage  History  Jennifer Andersen has a past medical history of History of kidney stones, Inguinal hernia (2010), and Monocytosis.   She has a past surgical history that includes Hernia repair (2010) and Extracorporeal shock wave lithotripsy (Left, 06/28/2018).   Her family history includes Asthma in her sister; Healthy in her father and mother.She reports that she has never smoked. She has never used smokeless tobacco. She reports current alcohol use. She reports that she does not use drugs.  Outpatient Medications Prior to Visit  Medication Sig Dispense Refill   Levonorgestrel (SKYLA) 13.5 MG IUD by Intrauterine route. Inserted 07/07/2020     benzonatate (TESSALON) 200 MG capsule Take 1 capsule (200 mg total) by mouth 3 (three) times daily as needed. (Patient not taking: Reported on 09/23/2022) 30 capsule 0   pseudoephedrine (SUDAFED 12 HOUR) 120 MG 12 hr tablet Take 1 tablet (120 mg total) by mouth 2 (two) times daily. (Patient not taking: Reported on 09/23/2022) 14 tablet 0   No facility-administered medications prior to visit.    Review of Systems:  Patient denies headache, fevers, malaise, unintentional weight loss, skin rash, eye pain, sinus congestion and sinus pain, sore throat, dysphagia,  hemoptysis , cough, dyspnea, wheezing, chest pain, palpitations, orthopnea, edema, abdominal pain,  nausea, melena, diarrhea, constipation, flank pain, dysuria, hematuria, urinary  Frequency, nocturia, numbness, tingling, seizures,  Focal weakness, Loss of consciousness,  Tremor, insomnia, depression, anxiety, and suicidal ideation.     Objective:  BP 116/74   Pulse 88   Temp 97.6 F (36.4 C) (Oral)   Ht 5\' 5"  (1.651 m)   Wt 157 lb 3.2 oz (71.3 kg)   LMP  (LMP Unknown)   SpO2 99%   BMI 26.16 kg/m   Physical Exam  Assessment & Plan:  There are no diagnoses linked to this encounter.   Follow-up: No follow-ups on file.   I provided 30 minutes during this encounter reviewing patient's last visit with previous provider,  most recent imaging studies and labs.  Provided counseling on the above mentioned problems and coordination of care.   , MD

## 2022-09-25 ENCOUNTER — Encounter: Payer: Self-pay | Admitting: Internal Medicine

## 2022-09-25 DIAGNOSIS — Z8619 Personal history of other infectious and parasitic diseases: Secondary | ICD-10-CM | POA: Insufficient documentation

## 2022-09-25 DIAGNOSIS — E663 Overweight: Secondary | ICD-10-CM | POA: Insufficient documentation

## 2022-09-25 DIAGNOSIS — Z87442 Personal history of urinary calculi: Secondary | ICD-10-CM | POA: Insufficient documentation

## 2022-09-25 DIAGNOSIS — G4700 Insomnia, unspecified: Secondary | ICD-10-CM | POA: Insufficient documentation

## 2022-09-25 NOTE — Assessment & Plan Note (Signed)
Encouarged to increase water intake to 60 ounces daly

## 2022-09-25 NOTE — Assessment & Plan Note (Signed)
Aggravated by working  3rd shift .  Encouraged use of non pharmacologic methods, including exercise ,  meditation , melatonin

## 2022-09-25 NOTE — Assessment & Plan Note (Addendum)
I have addressed  BMI and recommended a low glycemic index diet utilizing smaller more frequent meals to increase metabolism.  I have also recommended that patient start exercising with a goal of 30 minutes of aerobic exercise a minimum of 5 days per week. Screening for lipid disorders, thyroid and diabetes to be done today.   Lab Results  Component Value Date   TSH 1.34 09/23/2022

## 2022-09-27 ENCOUNTER — Telehealth: Payer: Self-pay

## 2022-09-27 NOTE — Telephone Encounter (Signed)
LMTCB in regards to lab results.  

## 2022-09-27 NOTE — Telephone Encounter (Signed)
-----   Message from Crecencio Mc, MD sent at 09/26/2022  2:57 PM EST ----- Your Cholesterol, thyroid , liver and kidney function are normal.    Regards,  Dr. Derrel Nip

## 2022-10-13 ENCOUNTER — Telehealth: Payer: Self-pay

## 2022-10-13 NOTE — Telephone Encounter (Signed)
error 

## 2022-11-14 ENCOUNTER — Ambulatory Visit
Admission: RE | Admit: 2022-11-14 | Discharge: 2022-11-14 | Disposition: A | Discharge status: Home / Self Care | Payer: Commercial Managed Care - PPO | Source: Ambulatory Visit | Attending: Emergency Medicine | Admitting: Emergency Medicine

## 2022-11-14 VITALS — BP 129/78 | HR 88 | Temp 98.1°F | Ht 65.0 in | Wt 160.0 lb

## 2022-11-14 DIAGNOSIS — J020 Streptococcal pharyngitis: Secondary | ICD-10-CM | POA: Diagnosis not present

## 2022-11-14 LAB — GROUP A STREP BY PCR: Group A Strep by PCR: DETECTED — AB

## 2022-11-14 MED ORDER — AMOXICILLIN-POT CLAVULANATE 875-125 MG PO TABS: 1.0000 | ORAL_TABLET | Freq: Two times a day (BID) | ORAL | 0 refills | Status: AC

## 2022-11-14 NOTE — Discharge Instructions (Addendum)
Take the Augmentin twice daily for 10 days for treatment of your strep throat.  Gargle with warm salt water 2-3 times a day to soothe your throat, aid in pain relief, and aid in healing.  Take over-the-counter ibuprofen according to the package instructions as needed for pain.  You can also use Chloraseptic or Sucrets lozenges, 1 lozenge every 2 hours as needed for throat pain.  If you develop any new or worsening symptoms return for reevaluation.

## 2022-11-14 NOTE — ED Triage Notes (Signed)
Pt c/o sore throat onset yesterday morning. Pt states has been worsening, hurting to swallow, fever yesterday 99.7

## 2022-11-14 NOTE — ED Provider Notes (Signed)
[facility]    CSN: [id number] Arrival date & time: 11/14/22  1846      History   Chief Complaint Chief Complaint  Patient presents with   Sore Throat    Entered by patient    HPI Jennifer Andersen is a 22 y.o. female.   HPI  22 year old female here for evaluation of sore throat.  The patient works third shift as a Marine scientist at [employer] and reports that she started developing a sore throat last night and had an elevated temp of 99.7.  Today her sore throat became worse and now she is having painful swallowing.  She denies any runny nose, nasal congestion, or cough.  She is unaware of any sick contacts.  She states she has not taken care of any patients with known strep or COVID recently.  Past Medical History:  Diagnosis Date   History of kidney stones    Inguinal hernia 2010   Monocytosis     Patient Active Problem List   Diagnosis Date Noted   Overweight (BMI 25.0-29.9) 09/25/2022   Insomnia 09/25/2022   History of nephrolithiasis 09/25/2022   History of infectious mononucleosis 09/25/2022    Past Surgical History:  Procedure Laterality Date   EXTRACORPOREAL SHOCK WAVE LITHOTRIPSY Left 06/28/2018   Procedure: EXTRACORPOREAL SHOCK WAVE LITHOTRIPSY (ESWL);  Surgeon: Hollice Espy, MD;  Location: [facility] ORS;  Service: Urology;  Laterality: Left;   HERNIA REPAIR  2010   right inguinal    OB History     Gravida  0   Para  0   Term  0   Preterm  0   AB  0   Living  0      SAB  0   IAB  0   Ectopic  0   Multiple  0   Live Births  0            Home Medications    Prior to Admission medications   Medication Sig Start Date End Date Taking? Authorizing Provider  amoxicillin-clavulanate (AUGMENTIN) 875-125 MG tablet Take 1 tablet by mouth every 12 (twelve) hours for 10 days. 11/14/22 11/24/22 Yes Margarette Canada, NP  Levonorgestrel (SKYLA) 13.5 MG IUD by Intrauterine route. Inserted 07/07/2020    [provider]     Family History Family History  Problem Relation Age of Onset   Healthy Mother    Multiple sclerosis Mother    Healthy Father    Asthma Sister     Social History Social History   Tobacco Use   Smoking status: Never   Smokeless tobacco: Never  Vaping Use   Vaping Use: Never used  Substance Use Topics   Alcohol use: Yes   Drug use: No     Allergies   Patient has no known allergies.   Review of Systems Review of Systems  Constitutional:  Positive for fever.  HENT:  Positive for sore throat. Negative for congestion, ear pain and rhinorrhea.   Respiratory:  Negative for cough.      Physical Exam Triage Vital Signs ED Triage Vitals  Enc Vitals Group     BP 11/14/22 1858 129/78     Pulse Rate 11/14/22 1858 88     Resp --      Temp 11/14/22 1858 98.1 F (36.7 C)     Temp Source 11/14/22 1858 Oral     SpO2 11/14/22 1858 99 %     Weight 11/14/22 1858 160 lb (72.6 kg)  Height 11/14/22 1858 5' 5"$  (1.651 m)     Head Circumference --      Peak Flow --      Pain Score 11/14/22 1857 4     Pain Loc --      Pain Edu? --      Excl. in [place]? --    No data found.  Updated Vital Signs BP 129/78 (BP Location: Right Arm)   Pulse 88   Temp 98.1 F (36.7 C) (Oral)   Ht 5' 5"$  (1.651 m)   Wt 160 lb (72.6 kg)   SpO2 99%   BMI 26.63 kg/m   Visual Acuity Right Eye Distance:   Left Eye Distance:   Bilateral Distance:    Right Eye Near:   Left Eye Near:    Bilateral Near:     Physical Exam Vitals and nursing note reviewed.  Constitutional:      Appearance: Normal appearance. She is not ill-appearing.  HENT:     Head: Normocephalic and atraumatic.     Right Ear: Tympanic membrane, ear canal and external ear normal. There is no impacted cerumen.     Left Ear: Tympanic membrane, ear canal and external ear normal. There is no impacted cerumen.     Nose: Nose normal. No congestion or rhinorrhea.     Mouth/Throat:     Mouth: Mucous membranes are moist.      Pharynx: Oropharyngeal exudate and posterior oropharyngeal erythema present.     Comments: Bilateral tonsillar pillars are erythematous, edematous, and covered in white exudate. Cardiovascular:     Rate and Rhythm: Normal rate and regular rhythm.     Pulses: Normal pulses.     Heart sounds: Normal heart sounds. No murmur heard.    No friction rub. No gallop.  Pulmonary:     Effort: Pulmonary effort is normal.     Breath sounds: Normal breath sounds. No wheezing, rhonchi or rales.  Musculoskeletal:     Cervical back: Normal range of motion and neck supple.  Lymphadenopathy:     Cervical: Cervical adenopathy present.  Skin:    General: Skin is warm and dry.     Capillary Refill: Capillary refill takes less than 2 seconds.     Findings: No erythema or rash.  Neurological:     General: No focal deficit present.     Mental Status: She is alert and oriented to person, place, and time.  Psychiatric:        Mood and Affect: Mood normal.        Behavior: Behavior normal.        Thought Content: Thought content normal.        Judgment: Judgment normal.      UC Treatments / Results  Labs (all labs ordered are listed, but only abnormal results are displayed) Labs Reviewed  GROUP A STREP BY PCR - Abnormal; Notable for the following components:      Result Value   Group A Strep by PCR DETECTED (*)    All other components within normal limits    EKG   Radiology No results found.  Procedures Procedures (including critical care time)  Medications Ordered in UC Medications - No data to display  Initial Impression / Assessment and Plan / UC Course  I have reviewed the triage vital signs and the nursing notes.  Pertinent labs & imaging results that were available during my care of the patient were reviewed by me and considered in my medical decision making (  see chart for details).   The patient is a pleasant, nontoxic-appearing 22 year old female here for evaluation of sore  throat and fever that began yesterday.  She denies any other upper or lower respiratory symptoms.  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  On exam she does have erythematous and edematous tonsillar pillars with white exudate and anterior cervical lymphadenopathy.  Remainder of her upper respiratory tree is unremarkable.  Cardiopulmonary exam is benign.  I will order a strep PCR.  Strep PCR is positive.  I will discharge patient home on Augmentin 875 twice daily for 10 days for treatment of strep pharyngitis.  Tylenol and/or ibuprofen as needed for pain.  Salt water gargles Chloraseptic and Sucrets lozenges for discomfort as well.  Work note provided.   Final Clinical Impressions(s) / UC Diagnoses   Final diagnoses:  Strep pharyngitis     Discharge Instructions      Take the Augmentin twice daily for 10 days for treatment of your strep throat.  Gargle with warm salt water 2-3 times a day to soothe your throat, aid in pain relief, and aid in healing.  Take over-the-counter ibuprofen according to the package instructions as needed for pain.  You can also use Chloraseptic or Sucrets lozenges, 1 lozenge every 2 hours as needed for throat pain.  If you develop any new or worsening symptoms return for reevaluation.      ED Prescriptions     Medication Sig Dispense Auth. Provider   amoxicillin-clavulanate (AUGMENTIN) 875-125 MG tablet Take 1 tablet by mouth every 12 (twelve) hours for 10 days. 20 tablet Margarette Canada, NP      PDMP not reviewed this encounter.   Margarette Canada, NP 11/14/22 1928

## 2023-07-10 NOTE — Progress Notes (Deleted)
    GYNECOLOGY OFFICE PROCEDURE NOTE  Jennifer Andersen is a 22 y.o. G0P0000 here for [facility] IUD removal and reinsertion. No GYN concerns.  Last pap smear was on 08/12/2020 and was normal.  IUD Removal and Reinsertion  Patient identified, informed consent performed, consent signed.   Discussed risks of irregular bleeding, cramping, infection, malpositioning or misplacement of the IUD outside the uterus which may require further procedures. Also discussed >99% contraception efficacy, increased risk of ectopic pregnancy with failure of method.   Emphasized that this did not protect against STIs, condoms recommended during all sexual encounters.Advised to use backup contraception for one week as the risk of pregnancy is higher during the transition period of removing an IUD and replacing it with another one. Time out was performed. Speculum placed in the vagina. The strings of the IUD were grasped and pulled using ring forceps. The IUD was successfully removed in its entirety. The cervix was cleaned with Betadine x 2 and grasped anteriorly with a single tooth tenaculum.  The new *** IUD insertion apparatus was used to sound the uterus to *** cm;  the IUD was then placed per manufacturer's recommendations. Strings trimmed to 3 cm. Tenaculum was removed, good hemostasis noted. Patient tolerated procedure well.   Patient was given post-procedure instructions.  She was reminded to have backup contraception for one week during this transition period between IUDs.  Patient was also asked to check IUD strings periodically and follow up in 4 weeks for IUD check.   Hildred Laser, MD [facility]

## 2023-07-11 ENCOUNTER — Ambulatory Visit: Payer: Commercial Managed Care - PPO | Admitting: Obstetrics and Gynecology

## 2023-07-11 DIAGNOSIS — Z124 Encounter for screening for malignant neoplasm of cervix: Secondary | ICD-10-CM

## 2023-07-11 DIAGNOSIS — Z30433 Encounter for removal and reinsertion of intrauterine contraceptive device: Secondary | ICD-10-CM

## 2023-07-11 DIAGNOSIS — Z113 Encounter for screening for infections with a predominantly sexual mode of transmission: Secondary | ICD-10-CM

## 2023-07-11 DIAGNOSIS — Z30430 Encounter for insertion of intrauterine contraceptive device: Secondary | ICD-10-CM

## 2023-08-07 ENCOUNTER — Telehealth: Payer: Self-pay | Admitting: Obstetrics and Gynecology

## 2023-08-07 NOTE — Telephone Encounter (Signed)
Contacted the patient via phone x2. The call was answered but no one spoke. I was calling to let her know we would be able to only do her annual exam tomorrow. And have to rescheduled for replacement for IUD another day. I wasn't able to leave message.

## 2023-08-07 NOTE — Patient Instructions (Incomplete)
IUD PLACEMENT POST-PROCEDURE INSTRUCTIONS ° °You may take Ibuprofen, Aleve or Tylenol for pain if needed.  Cramping should resolve within in 24 hours. ° °You may have a small amount of spotting.  You should wear a mini pad for the next few days. ° °You may have intercourse after 24 hours.  If you using this for birth control, it is effective immediately. ° °You need to call if you have any pelvic pain, fever, heavy bleeding or foul smelling vaginal discharge.  Irregular bleeding is common the first several months after having an IUD placed. You do not need to call for this reason unless you are concerned. ° °Shower or bathe as normal ° °You should have a follow-up appointment in 4-8 weeks for a re-check to make sure you are not having any problems. °

## 2023-08-08 ENCOUNTER — Encounter: Payer: Self-pay | Admitting: Obstetrics and Gynecology

## 2023-08-08 ENCOUNTER — Ambulatory Visit: Payer: Commercial Managed Care - PPO | Admitting: Obstetrics and Gynecology

## 2023-08-08 VITALS — BP 117/82 | HR 80 | Resp 16 | Ht 66.0 in | Wt 147.5 lb

## 2023-08-08 DIAGNOSIS — Z30433 Encounter for removal and reinsertion of intrauterine contraceptive device: Secondary | ICD-10-CM | POA: Diagnosis not present

## 2023-08-08 DIAGNOSIS — Z01419 Encounter for gynecological examination (general) (routine) without abnormal findings: Secondary | ICD-10-CM

## 2023-08-08 DIAGNOSIS — Z1159 Encounter for screening for other viral diseases: Secondary | ICD-10-CM

## 2023-08-08 DIAGNOSIS — Z114 Encounter for screening for human immunodeficiency virus [HIV]: Secondary | ICD-10-CM

## 2023-08-08 DIAGNOSIS — Z124 Encounter for screening for malignant neoplasm of cervix: Secondary | ICD-10-CM

## 2023-08-08 MED ORDER — LEVONORGESTREL 13.5 MG IU IUD: INTRAUTERINE_SYSTEM | Freq: Once | INTRAUTERINE | Status: AC

## 2023-08-08 NOTE — Progress Notes (Signed)
    GYNECOLOGY OFFICE PROCEDURE NOTE  Jennifer Andersen is a 22 y.o. G0P0000 here for [facility] IUD removal and reinsertion. No GYN concerns.  Last pap smear was on 08/12/2020 and was normal.  IUD Removal and Reinsertion  Patient identified, informed consent performed, consent signed.   Discussed risks of irregular bleeding, cramping, infection, malpositioning or misplacement of the IUD outside the uterus which may require further procedures. Also discussed >99% contraception efficacy, increased risk of ectopic pregnancy with failure of method.   Emphasized that this did not protect against STIs, condoms recommended during all sexual encounters.Advised to use backup contraception for one week as the risk of pregnancy is higher during the transition period of removing an IUD and replacing it with another one. Time out was performed. Speculum placed in the vagina. The strings of the IUD were grasped and pulled using ring forceps. The IUD was successfully removed in its entirety. The cervix was cleaned with Betadine x 2 and grasped anteriorly with a single tooth tenaculum.  The new Skyla IUD insertion apparatus was used to sound the uterus to 8.5 cm;  the IUD was then placed per manufacturer's recommendations. Strings trimmed to 3 cm. Tenaculum was removed, good hemostasis noted. Patient tolerated procedure well.   Patient was given post-procedure instructions.  She was reminded to have backup contraception for one week during this transition period between IUDs.  Patient was also asked to check IUD strings periodically and follow up in 4 weeks for IUD check and annual exam.   Hildred Laser, MD [facility]

## 2023-09-13 ENCOUNTER — Ambulatory Visit: Payer: Commercial Managed Care - PPO | Admitting: Obstetrics and Gynecology

## 2023-09-21 DIAGNOSIS — D234 Other benign neoplasm of skin of scalp and neck: Secondary | ICD-10-CM | POA: Diagnosis not present

## 2023-09-21 DIAGNOSIS — D485 Neoplasm of uncertain behavior of skin: Secondary | ICD-10-CM | POA: Diagnosis not present

## 2023-09-21 DIAGNOSIS — L814 Other melanin hyperpigmentation: Secondary | ICD-10-CM | POA: Diagnosis not present

## 2024-09-10 ENCOUNTER — Ambulatory Visit: Admitting: Internal Medicine

## 2024-09-10 ENCOUNTER — Encounter: Payer: Self-pay | Admitting: Internal Medicine

## 2024-09-10 ENCOUNTER — Other Ambulatory Visit (HOSPITAL_COMMUNITY)
Admission: RE | Admit: 2024-09-10 | Discharge: 2024-09-10 | Disposition: A | Source: Ambulatory Visit | Attending: Internal Medicine | Admitting: Internal Medicine

## 2024-09-10 VITALS — BP 128/70 | HR 83 | Ht 66.0 in | Wt 144.0 lb

## 2024-09-10 DIAGNOSIS — R634 Abnormal weight loss: Secondary | ICD-10-CM

## 2024-09-10 DIAGNOSIS — Z124 Encounter for screening for malignant neoplasm of cervix: Secondary | ICD-10-CM

## 2024-09-10 DIAGNOSIS — E782 Mixed hyperlipidemia: Secondary | ICD-10-CM | POA: Diagnosis not present

## 2024-09-10 DIAGNOSIS — R5383 Other fatigue: Secondary | ICD-10-CM

## 2024-09-10 DIAGNOSIS — R7301 Impaired fasting glucose: Secondary | ICD-10-CM | POA: Diagnosis not present

## 2024-09-10 DIAGNOSIS — Z113 Encounter for screening for infections with a predominantly sexual mode of transmission: Secondary | ICD-10-CM | POA: Diagnosis not present

## 2024-09-10 DIAGNOSIS — Z0000 Encounter for general adult medical examination without abnormal findings: Secondary | ICD-10-CM

## 2024-09-10 DIAGNOSIS — E663 Overweight: Secondary | ICD-10-CM

## 2024-09-10 NOTE — Patient Instructions (Addendum)
 Good to see you!   We got you caught up  on cervical CA screening which includes screening for STDS's  Please don't lose any more weight!  Make sure you eating plenty of protein

## 2024-09-10 NOTE — Progress Notes (Unsigned)
 Patient ID: Jennifer Andersen, female    DOB: 02-12-2001  Age: 23 y.o. MRN: [id number]  The patient is here for annual preventive examination and management of other chronic and acute problems. LAST SEEN DEC 2023   The risk factors are reflected in the social history.   The roster of all physicians providing medical care to patient - is listed in the Snapshot section of the chart.   Activities of daily living:  The patient is 100% independent in all ADLs: dressing, toileting, feeding as well as independent mobility   Home safety : The patient has smoke detectors in the home. They wear seatbelts.  There are no unsecured firearms at home. There is no violence in the home.    There is no risks for hepatitis, STDs or HIV. There is no   history of blood transfusion. They have no travel history to infectious disease endemic areas of the world.   The patient has seen their dentist in the last six month. They have seen their eye doctor in the last year. The patinet  denies slight hearing difficulty with regard to whispered voices and some television programs.  They have deferred audiologic testing in the last year.  They do not  have excessive sun exposure. Discussed the need for sun protection: hats, long sleeves and use of sunscreen if there is significant sun exposure.    Diet: the importance of a healthy diet is discussed. They do have a healthy diet.   The benefits of regular aerobic exercise were discussed. The patient  exercises  3 to 5 days per week  for  60 minutes.    Depression screen: there are no signs or vegative symptoms of depression- irritability, change in appetite, anhedonia, sadness/tearfullness.   The following portions of the patient's history were reviewed and updated as appropriate: allergies, current medications, past family history, past medical history,  past surgical history, past social history  and problem list.   Visual acuity was not assessed per patient preference since  the patient has regular follow up with an  ophthalmologist. Hearing and body mass index were assessed and reviewed.    During the course of the visit the patient was educated and counseled about appropriate screening and preventive services including : fall prevention , diabetes screening, nutrition counseling, colorectal cancer screening, and recommended immunizations.    Chief Complaint:  1) GYN:  IUD replaced  Dec 2024 by Archie Savers.  Did not return for PAP smear.  Last PAP 2021 plans to follow up with GYN  but has not scheduled appt.  Sexually active.   2) excessive weight loss:  43 lb weight loss since Dec 2023 visit .  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 Sister is is occupational therapy there   Review of Symptoms  Patient denies headache, fevers, malaise, unintentional weight loss, skin rash, eye pain, sinus congestion and sinus pain, sore throat, dysphagia,  hemoptysis , cough, dyspnea, wheezing, chest pain, palpitations, orthopnea, edema, abdominal pain, nausea, melena, diarrhea, constipation, flank pain, dysuria, hematuria, urinary  Frequency, nocturia, numbness, tingling, seizures,  Focal weakness, Loss of consciousness,  Tremor, insomnia, depression, anxiety, and suicidal  ideation.    Physical Exam:  BP 128/70   Pulse 83   Ht 5' 6 (1.676 m)   Wt 114 lb 3.2 oz (51.8 kg)   SpO2 99%   BMI 18.43 kg/m    Physical Exam  Assessment and Plan: Overweight (BMI 25.0-29.9)  Encounter for preventative adult health care examination    No follow-ups on file.  Jennifer LITTIE Kettering, MD

## 2024-09-11 ENCOUNTER — Ambulatory Visit: Payer: Self-pay | Admitting: Internal Medicine

## 2024-09-11 DIAGNOSIS — Z0000 Encounter for general adult medical examination without abnormal findings: Secondary | ICD-10-CM | POA: Insufficient documentation

## 2024-09-11 LAB — CBC WITH DIFFERENTIAL/PLATELET
Basophils Absolute: 0 K/uL (ref 0.0–0.1)
Basophils Relative: 0.3 % (ref 0.0–3.0)
Eosinophils Absolute: 0.1 K/uL (ref 0.0–0.7)
Eosinophils Relative: 1.6 % (ref 0.0–5.0)
HCT: 41 % (ref 36.0–46.0)
Hemoglobin: 13.9 g/dL (ref 12.0–15.0)
Lymphocytes Relative: 26.3 % (ref 12.0–46.0)
Lymphs Abs: 2.4 K/uL (ref 0.7–4.0)
MCHC: 34 g/dL (ref 30.0–36.0)
MCV: 88 fl (ref 78.0–100.0)
Monocytes Absolute: 0.5 K/uL (ref 0.1–1.0)
Monocytes Relative: 5.8 % (ref 3.0–12.0)
Neutro Abs: 5.9 K/uL (ref 1.4–7.7)
Neutrophils Relative %: 66 % (ref 43.0–77.0)
Platelets: 228 K/uL (ref 150.0–400.0)
RBC: 4.66 Mil/uL (ref 3.87–5.11)
RDW: 12.8 % (ref 11.5–15.5)
WBC: 9 K/uL (ref 4.0–10.5)

## 2024-09-11 LAB — COMPREHENSIVE METABOLIC PANEL WITH GFR
ALT: 9 U/L (ref 3–35)
AST: 14 U/L (ref 5–37)
Albumin: 4.8 g/dL (ref 3.5–5.2)
Alkaline Phosphatase: 44 U/L (ref 39–117)
BUN: 10 mg/dL (ref 6–23)
CO2: 27 meq/L (ref 19–32)
Calcium: 9.8 mg/dL (ref 8.4–10.5)
Chloride: 103 meq/L (ref 96–112)
Creatinine, Ser: 0.82 mg/dL (ref 0.40–1.20)
GFR: 99.68 mL/min (ref >60.00)
Glucose, Bld: 90 mg/dL (ref 70–99)
Potassium: 3.8 meq/L (ref 3.5–5.1)
Sodium: 138 meq/L (ref 135–145)
Total Bilirubin: 0.4 mg/dL (ref 0.2–1.2)
Total Protein: 7.5 g/dL (ref 6.0–8.3)

## 2024-09-11 LAB — LIPID PANEL
Cholesterol: 139 mg/dL (ref 28–200)
HDL: 50.7 mg/dL (ref >39.00)
LDL Cholesterol: 75 mg/dL (ref 10–99)
NonHDL: 88.28
Total CHOL/HDL Ratio: 3
Triglycerides: 68 mg/dL (ref 10.0–149.0)
VLDL: 13.6 mg/dL (ref 0.0–40.0)

## 2024-09-11 LAB — TSH: TSH: 3.28 u[IU]/mL (ref 0.35–5.50)

## 2024-09-11 LAB — LDL CHOLESTEROL, DIRECT: Direct LDL: 81 mg/dL

## 2024-09-11 LAB — HEMOGLOBIN A1C: Hgb A1c MFr Bld: 5 % (ref 4.6–6.5)

## 2024-09-11 NOTE — Assessment & Plan Note (Signed)

## 2024-09-12 LAB — HIV-1 RNA QUANT-NO REFLEX-BLD
HIV 1 RNA Quant: NOT DETECTED {copies}/mL
HIV-1 RNA Quant, Log: NOT DETECTED {Log_copies}/mL

## 2024-09-12 LAB — HEPATITIS C ANTIBODY: Hepatitis C Ab: NONREACTIVE

## 2024-09-16 LAB — CYTOLOGY - PAP
Chlamydia: NEGATIVE
Comment: NEGATIVE
Comment: NEGATIVE
Comment: NEGATIVE
Comment: NORMAL
Diagnosis: NEGATIVE
HSV1: NEGATIVE
HSV2: NEGATIVE
Neisseria Gonorrhea: NEGATIVE
Trichomonas: NEGATIVE

## 2025-09-11 ENCOUNTER — Encounter: Admitting: Internal Medicine
